# Patient Record
Sex: Female | Born: 2000 | Race: Black or African American | Hispanic: No | Marital: Single | Smoking: Former smoker
Health system: Southern US, Community
[De-identification: ages and names within clinical notes are randomized; demographics above are authoritative.]

## PROBLEM LIST (undated history)

## (undated) DIAGNOSIS — K219 Gastro-esophageal reflux disease without esophagitis: Secondary | ICD-10-CM

## (undated) DIAGNOSIS — O99212 Obesity complicating pregnancy, second trimester: Secondary | ICD-10-CM

## (undated) HISTORY — DX: Gastro-esophageal reflux disease without esophagitis: K21.9

## (undated) HISTORY — PX: NO PAST SURGERIES: SHX2092

## (undated) HISTORY — DX: Obesity complicating pregnancy, second trimester: O99.212

---
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
## 2022-05-10 DIAGNOSIS — N72 Inflammatory disease of cervix uteri: Secondary | ICD-10-CM | POA: Diagnosis not present

## 2022-05-10 DIAGNOSIS — Z3201 Encounter for pregnancy test, result positive: Secondary | ICD-10-CM | POA: Diagnosis not present

## 2022-05-10 DIAGNOSIS — Z30431 Encounter for routine checking of intrauterine contraceptive device: Secondary | ICD-10-CM | POA: Diagnosis not present

## 2022-05-14 ENCOUNTER — Ambulatory Visit: Payer: Medicaid Other | Admitting: Obstetrics and Gynecology

## 2022-06-18 ENCOUNTER — Emergency Department (HOSPITAL_COMMUNITY): Payer: Medicaid Other

## 2022-06-18 ENCOUNTER — Other Ambulatory Visit: Payer: Self-pay

## 2022-06-18 ENCOUNTER — Emergency Department (HOSPITAL_COMMUNITY)
Admission: EM | Admit: 2022-06-18 | Discharge: 2022-06-18 | Disposition: A | Discharge status: Home / Self Care | Payer: Medicaid Other | Attending: Emergency Medicine | Admitting: Emergency Medicine

## 2022-06-18 ENCOUNTER — Encounter (HOSPITAL_COMMUNITY): Payer: Self-pay

## 2022-06-18 DIAGNOSIS — R079 Chest pain, unspecified: Secondary | ICD-10-CM | POA: Diagnosis present

## 2022-06-18 DIAGNOSIS — R7989 Other specified abnormal findings of blood chemistry: Secondary | ICD-10-CM | POA: Diagnosis not present

## 2022-06-18 DIAGNOSIS — R072 Precordial pain: Secondary | ICD-10-CM | POA: Diagnosis not present

## 2022-06-18 DIAGNOSIS — R1013 Epigastric pain: Secondary | ICD-10-CM | POA: Insufficient documentation

## 2022-06-18 LAB — CBC WITH DIFFERENTIAL/PLATELET
Abs Immature Granulocytes: 0.14 10*3/uL — ABNORMAL HIGH (ref 0.00–0.07)
Basophils Absolute: 0.1 10*3/uL (ref 0.0–0.1)
Basophils Relative: 1 %
Eosinophils Absolute: 0.1 10*3/uL (ref 0.0–0.5)
Eosinophils Relative: 1 %
HCT: 27 % — ABNORMAL LOW (ref 36.0–46.0)
Hemoglobin: 9.2 g/dL — ABNORMAL LOW (ref 12.0–15.0)
Immature Granulocytes: 2 %
Lymphocytes Relative: 56 %
Lymphs Abs: 5.2 10*3/uL — ABNORMAL HIGH (ref 0.7–4.0)
MCH: 27.1 pg (ref 26.0–34.0)
MCHC: 34.1 g/dL (ref 30.0–36.0)
MCV: 79.6 fL — ABNORMAL LOW (ref 80.0–100.0)
Monocytes Absolute: 0.4 10*3/uL (ref 0.1–1.0)
Monocytes Relative: 4 %
Neutro Abs: 3.3 10*3/uL (ref 1.7–7.7)
Neutrophils Relative %: 36 %
Platelets: 292 10*3/uL (ref 150–400)
RBC: 3.39 MIL/uL — ABNORMAL LOW (ref 3.87–5.11)
RDW: 22.9 % — ABNORMAL HIGH (ref 11.5–15.5)
WBC: 9.1 10*3/uL (ref 4.0–10.5)
nRBC: 0.5 % — ABNORMAL HIGH (ref 0.0–0.2)

## 2022-06-18 LAB — COMPREHENSIVE METABOLIC PANEL WITH GFR
ALT: 37 U/L (ref 0–44)
AST: 34 U/L (ref 15–41)
Albumin: 4.1 g/dL (ref 3.5–5.0)
Alkaline Phosphatase: 92 U/L (ref 38–126)
Anion gap: 6 (ref 5–15)
BUN: 15 mg/dL (ref 6–20)
CO2: 21 mmol/L — ABNORMAL LOW (ref 22–32)
Calcium: 8.1 mg/dL — ABNORMAL LOW (ref 8.9–10.3)
Chloride: 107 mmol/L (ref 98–111)
Creatinine, Ser: 0.59 mg/dL (ref 0.44–1.00)
GFR, Estimated: >60 mL/min
Glucose, Bld: 87 mg/dL (ref 70–99)
Potassium: 3.1 mmol/L — ABNORMAL LOW (ref 3.5–5.1)
Sodium: 134 mmol/L — ABNORMAL LOW (ref 135–145)
Total Bilirubin: 3.6 mg/dL — ABNORMAL HIGH (ref 0.3–1.2)
Total Protein: 7.6 g/dL (ref 6.5–8.1)

## 2022-06-18 LAB — LIPASE, BLOOD: Lipase: 44 U/L (ref 11–51)

## 2022-06-18 LAB — D-DIMER, QUANTITATIVE: D-Dimer, Quant: 1.86 ug{FEU}/mL — ABNORMAL HIGH (ref 0.00–0.50)

## 2022-06-18 LAB — HCG, QUANTITATIVE, PREGNANCY: hCG, Beta Chain, Quant, S: <1 m[IU]/mL (ref <5)

## 2022-06-18 MED ORDER — SODIUM CHLORIDE (PF) 0.9 % IJ SOLN
INTRAMUSCULAR | Status: AC
  Filled 2022-06-18: qty 50

## 2022-06-18 MED ORDER — ALUM & MAG HYDROXIDE-SIMETH 200-200-20 MG/5ML PO SUSP
30.0000 mL | Freq: Once | ORAL | Status: AC
  Administered 2022-06-18: 30 mL via ORAL
  Filled 2022-06-18: qty 30

## 2022-06-18 MED ORDER — IOHEXOL 350 MG/ML SOLN
100.0000 mL | Freq: Once | INTRAVENOUS | Status: AC | PRN
  Administered 2022-06-18: 100 mL via INTRAVENOUS

## 2022-06-18 NOTE — Discharge Instructions (Addendum)
Follow up with your family doctor.   Take 4 over the counter ibuprofen tablets 3 times a day or 2 over-the-counter naproxen tablets twice a day for pain. Also take tylenol '1000mg'$ (2 extra strength) four times a day.

## 2022-06-18 NOTE — ED Provider Notes (Signed)
[place] EMERGENCY DEPARTMENT AT [facility] Provider Note   CSN: [id number] Arrival date & time: 06/18/22  0012     History  Chief Complaint  Patient presents with   Abdominal Pain    Laurie Perez is a 22 y.o. female.  22 yo F with a cc of chest pain.  Worse with deep breathing, certain positions.  Denies pain with eating and drinking.  Denies cough congestion fever.  Denies trauma.  She denies history of PE or DVT.  Denies hemoptysis denies unilateral lower extremity edema.  Denies recent surgery.   Abdominal Pain      Home Medications Prior to Admission medications   Medication Sig Start Date End Date Taking? Authorizing Provider  acetaminophen (TYLENOL) 325 MG tablet Take 2 tablets (650 mg total) by mouth every 4 (four) hours as needed (for pain scale < 4). Patient not taking: Reported on 04/08/2021 04/03/21   Orvis Brill, DO  ibuprofen (ADVIL) 600 MG tablet Take 1 tablet (600 mg total) by mouth every 6 (six) hours. Patient not taking: Reported on 04/15/2021 04/03/21   Orvis Brill, DO  NIFEdipine (ADALAT CC) 60 MG 24 hr tablet Take 1 tablet (60 mg total) by mouth daily. Patient not taking: Reported on 06/22/2021 04/15/21   Clarnce Flock, MD  norgestimate-ethinyl estradiol (ORTHO-CYCLEN) 0.25-35 MG-MCG tablet Take 1 tablet by mouth daily. Patient not taking: Reported on 07/23/2021 06/22/21   Elvera Maria, CNM  Prenatal Vit-Fe Fumarate-FA (PRENATAL MULTIVITAMIN) TABS tablet Take 1 tablet by mouth daily at 12 noon. Patient not taking: Reported on 04/15/2021    [provider]      Allergies    Patient has no known allergies.    Review of Systems   Review of Systems  Gastrointestinal:  Positive for abdominal pain.    Physical Exam Updated Vital Signs BP (!) 152/80   Pulse 93   Temp 98.8 F (37.1 C) (Oral)   Resp 20   Wt 132 kg   SpO2 100%   BMI 44.25 kg/m  Physical Exam Vitals and nursing note reviewed.   Constitutional:      General: She is not in acute distress.    Appearance: She is well-developed. She is not diaphoretic.  HENT:     Head: Normocephalic and atraumatic.  Eyes:     Pupils: Pupils are equal, round, and reactive to light.  Cardiovascular:     Rate and Rhythm: Normal rate and regular rhythm.     Heart sounds: No murmur heard.    No friction rub. No gallop.  Pulmonary:     Effort: Pulmonary effort is normal.     Breath sounds: No wheezing or rales.  Abdominal:     General: There is no distension.     Palpations: Abdomen is soft.     Tenderness: There is abdominal tenderness in the epigastric area.     Comments: Pain to the epigastrium.  No obvious pain in the right upper quadrant.  Some pain over the sternum.  Musculoskeletal:        General: No tenderness.     Cervical back: Normal range of motion and neck supple.  Skin:    General: Skin is warm and dry.  Neurological:     Mental Status: She is alert and oriented to person, place, and time.  Psychiatric:        Behavior: Behavior normal.     ED Results / Procedures / Treatments   Labs (all labs ordered  are listed, but only abnormal results are displayed) Labs Reviewed  CBC WITH DIFFERENTIAL/PLATELET - Abnormal; Notable for the following components:      Result Value   RBC 3.39 (*)    Hemoglobin 9.2 (*)    HCT 27.0 (*)    MCV 79.6 (*)    RDW 22.9 (*)    nRBC 0.5 (*)    Lymphs Abs 5.2 (*)    Abs Immature Granulocytes 0.14 (*)    All other components within normal limits  COMPREHENSIVE METABOLIC PANEL - Abnormal; Notable for the following components:   Sodium 134 (*)    Potassium 3.1 (*)    CO2 21 (*)    Calcium 8.1 (*)    Total Bilirubin 3.6 (*)    All other components within normal limits  D-DIMER, QUANTITATIVE - Abnormal; Notable for the following components:   D-Dimer, Quant 1.86 (*)    All other components within normal limits  LIPASE, BLOOD  HCG, QUANTITATIVE, PREGNANCY    EKG EKG  Interpretation  Date/Time:  Friday June 18 2022 00:36:44 EST Ventricular Rate:  104 PR Interval:  160 QRS Duration: 81 QT Interval:  343 QTC Calculation: 452 R Axis:   7 Text Interpretation: Sinus tachycardia Low voltage, precordial leads No old tracing to compare Confirmed by Deno Etienne [phone]) on 06/18/2022 12:40:38 AM  Radiology CT Angio Chest PE W and/or Wo Contrast  Result Date: 06/18/2022 CLINICAL DATA:  Pulmonary embolism (PE) suspected, low to intermediate prob, positive D-dimer. Upper abdominal pain, nausea EXAM: CT ANGIOGRAPHY CHEST WITH CONTRAST TECHNIQUE: Multidetector CT imaging of the chest was performed using the standard protocol during bolus administration of intravenous contrast. Multiplanar CT image reconstructions and MIPs were obtained to evaluate the vascular anatomy. RADIATION DOSE REDUCTION: This exam was performed according to the departmental dose-optimization program which includes automated exposure control, adjustment of the mA and/or kV according to patient size and/or use of iterative reconstruction technique. CONTRAST:  146m OMNIPAQUE IOHEXOL 350 MG/ML SOLN COMPARISON:  None Available. FINDINGS: Cardiovascular: No filling defects in the pulmonary arteries to suggest pulmonary emboli. Insert heart is Mediastinum/Nodes: No mediastinal, hilar, or axillary adenopathy. Trachea and esophagus are unremarkable. Thyroid unremarkable. 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 Lungs/Pleura: Lungs are clear. No focal airspace opacities or suspicious nodules. No effusions. Upper Abdomen: No acute findings Musculoskeletal: Chest wall soft tissues are unremarkable. No acute bony abnormality. Review of the MIP images confirms the above findings. IMPRESSION: No evidence of pulmonary embolus. No acute cardiopulmonary disease. Electronically Signed   By: KRolm BaptiseM.D.   On: 06/18/2022 02:21   DG Chest Port 1 View  Result Date: 06/18/2022 CLINICAL DATA:  Chest pain EXAM: PORTABLE  CHEST 1 VIEW COMPARISON:  None Available. FINDINGS: The heart size and mediastinal contours are within normal limits. Both lungs are clear. The visualized skeletal structures are unremarkable. IMPRESSION: Negative. Electronically Signed   By: KRolm BaptiseM.D.   On: 06/18/2022 00:58    Procedures Procedures    Medications Ordered in ED Medications  sodium chloride (PF) 0.9 % injection (has no administration in time range)  alum & mag hydroxide-simeth (MAALOX/MYLANTA) 200-200-20 MG/5ML suspension 30 mL (30 mLs Oral Given 06/18/22 0041)  iohexol (OMNIPAQUE) 350 MG/ML injection 100 mL (100 mLs Intravenous Contrast Given 06/18/22 0205)    ED Course/ Medical Decision Making/ A&P  Medical Decision Making Amount and/or Complexity of Data Reviewed Labs: ordered. Radiology: ordered. ECG/medicine tests: ordered.  Risk OTC drugs. Prescription drug management.   22 yo F with a chief complaints of chest pain.  Going on for about 3 days now.  Worse with inspiration.  She is mildly tachycardic on arrival.  Unable to use the [facility] rule.  Will obtain a D-dimer.  Chest x-ray.  CBC CMP lipase.  Give a GI cocktail.  Reassess.  Ddimer elevated, will obtain CT.  LFT and lipase unremarkable.  CXR without focal infiltrate or ptx.  CT chest without PE or occult pneumonia.  Feeling better.  D/c home.   3:05 AM:  I have discussed the diagnosis/risks/treatment options with the patient.  Evaluation and diagnostic testing in the emergency department does not suggest an emergent condition requiring admission or immediate intervention beyond what has been performed at this time.  They will follow up with PCP. We also discussed returning to the ED immediately if new or worsening sx occur. We discussed the sx which are most concerning (e.g., sudden worsening pain, fever, inability to tolerate by mouth) that necessitate immediate return. Medications administered to the patient during their  visit and any new prescriptions provided to the patient are listed below.  Medications given during this visit Medications  sodium chloride (PF) 0.9 % injection (has no administration in time range)  alum & mag hydroxide-simeth (MAALOX/MYLANTA) 200-200-20 MG/5ML suspension 30 mL (30 mLs Oral Given 06/18/22 0041)  iohexol (OMNIPAQUE) 350 MG/ML injection 100 mL (100 mLs Intravenous Contrast Given 06/18/22 0205)     The patient appears reasonably screen and/or stabilized for discharge and I doubt any other medical condition or other [facility] requiring further screening, evaluation, or treatment in the ED at this time prior to discharge.          Final Clinical Impression(s) / ED Diagnoses Final diagnoses:  Nonspecific chest pain    Rx / DC Orders ED Discharge Orders     None         Deno Etienne, DO 06/18/22 [id number]

## 2022-06-18 NOTE — ED Triage Notes (Signed)
C/o intermittent upper abd pain with nausea, pain worse with inhalation x3 days.  Pt also states "I feel dehydated".

## 2022-06-21 ENCOUNTER — Emergency Department (HOSPITAL_COMMUNITY)
Admission: EM | Admit: 2022-06-21 | Discharge: 2022-06-21 | Disposition: A | Discharge status: Home / Self Care | Payer: Medicaid Other | Attending: Emergency Medicine | Admitting: Emergency Medicine

## 2022-06-21 ENCOUNTER — Encounter (HOSPITAL_COMMUNITY): Payer: Self-pay

## 2022-06-21 ENCOUNTER — Other Ambulatory Visit: Payer: Self-pay

## 2022-06-21 DIAGNOSIS — M542 Cervicalgia: Secondary | ICD-10-CM | POA: Insufficient documentation

## 2022-06-21 DIAGNOSIS — Z1152 Encounter for screening for COVID-19: Secondary | ICD-10-CM | POA: Insufficient documentation

## 2022-06-21 DIAGNOSIS — J029 Acute pharyngitis, unspecified: Secondary | ICD-10-CM | POA: Diagnosis not present

## 2022-06-21 DIAGNOSIS — B9789 Other viral agents as the cause of diseases classified elsewhere: Secondary | ICD-10-CM | POA: Diagnosis not present

## 2022-06-21 LAB — RESP PANEL BY RT-PCR (RSV, FLU A&B, COVID)  RVPGX2
Influenza A by PCR: NEGATIVE
Influenza B by PCR: NEGATIVE
Resp Syncytial Virus by PCR: NEGATIVE
SARS Coronavirus 2 by RT PCR: NEGATIVE

## 2022-06-21 LAB — GROUP A STREP BY PCR: Group A Strep by PCR: NOT DETECTED

## 2022-06-21 LAB — MONONUCLEOSIS SCREEN: Mono Screen: POSITIVE — AB

## 2022-06-21 MED ORDER — DEXAMETHASONE 4 MG PO TABS
4.0000 mg | ORAL_TABLET | Freq: Once | ORAL | Status: AC
  Administered 2022-06-21: 4 mg via ORAL
  Filled 2022-06-21: qty 1

## 2022-06-21 NOTE — ED Provider Triage Note (Signed)
Emergency Medicine Provider Triage Evaluation Note  Laurie Perez , a 22 y.o. female  was evaluated in triage.  Pt complains of sore throat.  Patient reports that she had a sore throat that began approximately 1 day ago.  She reports she is having some difficulty with eating and drinking foods or liquids due to significant throat pain.  Denies any recent exposure to sick contacts and with similar symptoms.  Denies any fevers, congestion, cough, abdominal pain, nausea vomiting, diarrhea..  Review of Systems  Positive: As above Negative: As above  Physical Exam  BP (!) 127/95 (BP Location: Left Arm)   Pulse 90   Temp 99 F (37.2 C) (Oral)   Resp 16   Ht '5\' 8"'$  (1.727 m)   Wt 123.4 kg   SpO2 100%   BMI 41.36 kg/m  Gen:   Awake, no distress   Resp:  Normal effort  MSK:   Moves extremities without difficulty  Other:  No obvious or significant oropharynx erythema noted.  No significant cervical lymphadenopathy.  Medical Decision Making  Medically screening exam initiated at 2:33 PM.  Appropriate orders placed.  Laurie Perez was informed that the remainder of the evaluation will be completed by another provider, this initial triage assessment does not replace that evaluation, and the importance of remaining in the ED until their evaluation is complete.     Luvenia Heller, PA-C 06/21/22 1434

## 2022-06-21 NOTE — ED Triage Notes (Signed)
Patient has had a sore throat since last night. Painful to swallow.

## 2022-06-21 NOTE — Discharge Instructions (Addendum)
You have been seen today for your complaint of sore throat. Your lab work was reassuring and showed no abnormalities. Your discharge medications include Alternate tylenol and ibuprofen for pain and fevers. You may alternate these every 4 hours. You may take up to 800 mg of ibuprofen at a time and up to 1000 mg of tylenol. Home care instructions are as follows:  Follow the directions listed in this packet for symptomatic relief Follow up with: Your primary care doctor in 1 week for reevaluation of your symptoms Please seek immediate medical care if you develop any of the following symptoms: Your neck becomes stiff. You drool or are unable to swallow liquids. You cannot drink or take medicines without vomiting. You have severe pain that does not go away, even after you take medicine. You have trouble breathing, and it is not caused by a stuffy nose. You have new pain and swelling in your joints such as the knees, ankles, wrists, or elbows. At this time there does not appear to be the presence of an emergent medical condition, however there is always the potential for conditions to change. Please read and follow the below instructions.  Do not take your medicine if  develop an itchy rash, swelling in your mouth or lips, or difficulty breathing; call 911 and seek immediate emergency medical attention if this occurs.  You may review your lab tests and imaging results in their entirety on your MyChart account.  Please discuss all results of fully with your primary care provider and other specialist at your follow-up visit.  Note: Portions of this text may have been transcribed using voice recognition software. Every effort was made to ensure accuracy; however, inadvertent computerized transcription errors may still be present.

## 2022-06-21 NOTE — ED Provider Notes (Signed)
Laurie Perez Provider Note   CSN: [id number] Arrival date & time: 06/21/22  1417     History  Chief Complaint  Patient presents with   Sore Throat    Laurie Perez is a 22 y.o. female.  With history of GERD who presents to the ED for evaluation of sore throat and neck pain.  Symptoms began yesterday and have progressively gotten worse.  States it is painful to fully open her mouth.  Also painful to swallow.  States it feels like swallowing glass.  Also states that she has a dry and cottony feeling in her throat.  It is difficult for her to eat at home due to the pain.  She was able to tolerate p.o. today with Tylenol which she reports did not improve her symptoms.  States she has had strep in the past and does not feel the same.  Denies fevers or chills, chest pain, difficulty breathing, voice change, drooling.  She reports feeling otherwise at baseline.   Sore Throat       Home Medications Prior to Admission medications   Medication Sig Start Date End Date Taking? Authorizing Provider  acetaminophen (TYLENOL) 325 MG tablet Take 2 tablets (650 mg total) by mouth every 4 (four) hours as needed (for pain scale < 4). Patient not taking: Reported on 04/08/2021 04/03/21   Orvis Brill, DO  ibuprofen (ADVIL) 600 MG tablet Take 1 tablet (600 mg total) by mouth every 6 (six) hours. Patient not taking: Reported on 04/15/2021 04/03/21   Orvis Brill, DO  NIFEdipine (ADALAT CC) 60 MG 24 hr tablet Take 1 tablet (60 mg total) by mouth daily. Patient not taking: Reported on 06/22/2021 04/15/21   Clarnce Flock, MD  norgestimate-ethinyl estradiol (ORTHO-CYCLEN) 0.25-35 MG-MCG tablet Take 1 tablet by mouth daily. Patient not taking: Reported on 07/23/2021 06/22/21   Laurie Perez, CNM  Prenatal Vit-Fe Fumarate-FA (PRENATAL MULTIVITAMIN) TABS tablet Take 1 tablet by mouth daily at 12 noon. Patient not taking: Reported on 04/15/2021     [provider]      Allergies    Patient has no known allergies.    Review of Systems   Review of Systems  HENT:  Positive for sore throat.   All other systems reviewed and are negative.   Physical Exam Updated Vital Signs BP (!) 127/95 (BP Location: Left Arm)   Pulse 90   Temp 99 F (37.2 C) (Oral)   Resp 16   Ht '5\' 8"'$  (1.727 m)   Wt 123.4 kg   SpO2 100%   BMI 41.36 kg/m  Physical Exam Vitals and nursing note reviewed.  Constitutional:      General: She is not in acute distress.    Appearance: She is well-developed. She is obese. She is not ill-appearing.  HENT:     Head: Normocephalic and atraumatic.     Mouth/Throat:     Mouth: Mucous membranes are moist.     Pharynx: Oropharynx is clear. Uvula midline.     Tonsils: 2+ on the right. 2+ on the left.     Comments: Bilateral tonsil stones present.  Uvula midline.  Palate soft to palpation and rises.  No obvious PTA or RPA.  No trismus.  Normal phonation.  No drooling or tripoding Eyes:     Conjunctiva/sclera: Conjunctivae normal.  Neck:     Comments: No posterior or anterior cervical lymphadenopathy Cardiovascular:     Rate and Rhythm: Normal rate and  regular rhythm.     Heart sounds: No murmur heard. Pulmonary:     Effort: Pulmonary effort is normal. No respiratory distress.     Breath sounds: Normal breath sounds. No stridor. No wheezing, rhonchi or rales.  Abdominal:     Palpations: Abdomen is soft.     Tenderness: There is no abdominal tenderness.  Musculoskeletal:        General: No swelling.     Cervical back: Neck supple.  Lymphadenopathy:     Cervical: No cervical adenopathy.  Skin:    General: Skin is warm and dry.     Capillary Refill: Capillary refill takes less than 2 seconds.  Neurological:     Mental Status: She is alert.  Psychiatric:        Mood and Affect: Mood normal.     ED Results / Procedures / Treatments   Labs (all labs ordered are listed, but only abnormal results are  displayed) Labs Reviewed  RESP PANEL BY RT-PCR (RSV, FLU A&B, COVID)  [id number]  Perez A STREP BY PCR  MONONUCLEOSIS SCREEN    EKG None  Radiology No results found.  Procedures Procedures    Medications Ordered in ED Medications  dexamethasone (DECADRON) tablet 4 mg (4 mg Oral Given 06/21/22 1701)    ED Course/ Medical Decision Making/ A&P                             Medical Decision Making Amount and/or Complexity of Data Reviewed Labs: ordered.  Risk Prescription drug management.  This patient presents to the ED for concern of sore throat, this involves an extensive number of treatment options, and is a complaint that carries with it a high risk of complications and morbidity.  The differential diagnosis includes viral pharyngitis, strep pharyngitis, mononucleosis, retropharyngeal or peritonsillar abscess  My initial workup includes respiratory panel, strep test  Additional history obtained from: Nursing notes from this visit.  I ordered, reviewed and interpreted labs which include: Respiratory panel, strep test, monoscreen.  Respiratory panel and strep test negative  Afebrile, hemodynamically stable.  22 year old female presents ED for evaluation of sore throat.  On exam, she has bilateral 2+ tonsil swelling with a midline uvula, mild posterior oropharyngeal erythema.  No retropharyngeal or peritonsillar abscess noted.  She is in no respiratory distress.  She is not drooling or tripoding.  Strep and aphthoid panel negative.  Patient was requesting monoscreen.  No cervical lymphadenopathy noted.  Patient likely has viral pharyngitis.  Was given 1 dose of Decadron in the ED and educated on appropriate home care for pharyngitis.  She was encouraged to follow-up with her primary care provider in 1 week for reevaluation of her symptoms.  She was given return precautions.  Stable at discharge.  At this time there does not appear to be any evidence of an acute emergency medical  condition and the patient appears stable for discharge with appropriate outpatient follow up. Diagnosis was discussed with patient who verbalizes understanding of care plan and is agreeable to discharge. I have discussed return precautions with patient who verbalizes understanding. Patient encouraged to follow-up with their PCP within 1 week. All questions answered. Note: Portions of this report may have been transcribed using voice recognition software. Every effort was made to ensure accuracy; however, inadvertent computerized transcription errors may still be present.        Final Clinical Impression(s) / ED Diagnoses Final diagnoses:  Viral pharyngitis  Rx / DC Orders ED Discharge Orders     None         Nehemiah Massed 06/21/22 1732    Cristie Hem, MD 06/21/22 [phone]

## 2022-06-23 ENCOUNTER — Emergency Department (HOSPITAL_COMMUNITY)
Admission: EM | Admit: 2022-06-23 | Discharge: 2022-06-23 | Disposition: A | Discharge status: Home / Self Care | Payer: Medicaid Other | Attending: Emergency Medicine | Admitting: Emergency Medicine

## 2022-06-23 ENCOUNTER — Other Ambulatory Visit: Payer: Self-pay

## 2022-06-23 DIAGNOSIS — B2790 Infectious mononucleosis, unspecified without complication: Secondary | ICD-10-CM | POA: Diagnosis not present

## 2022-06-23 DIAGNOSIS — B2799 Infectious mononucleosis, unspecified with other complication: Secondary | ICD-10-CM | POA: Diagnosis not present

## 2022-06-23 DIAGNOSIS — J029 Acute pharyngitis, unspecified: Secondary | ICD-10-CM | POA: Diagnosis not present

## 2022-06-23 LAB — GROUP A STREP BY PCR: Group A Strep by PCR: NOT DETECTED

## 2022-06-23 MED ORDER — PREDNISOLONE 15 MG/5ML PO SOLN: 50.0000 mg | Freq: Every day | ORAL | 0 refills | Status: AC

## 2022-06-23 MED ORDER — LIDOCAINE VISCOUS HCL 2 % MT SOLN
15.0000 mL | Freq: Once | OROMUCOSAL | Status: AC
  Administered 2022-06-23: 15 mL via OROMUCOSAL
  Filled 2022-06-23: qty 15

## 2022-06-23 MED ORDER — IBUPROFEN 100 MG/5ML PO SUSP
600.0000 mg | Freq: Once | ORAL | Status: AC
  Administered 2022-06-23: 600 mg via ORAL
  Filled 2022-06-23: qty 30

## 2022-06-23 NOTE — Discharge Instructions (Addendum)
Your test the other day was positive for mono.  Take liquid ibuprofen to help with your sore throat.  You can take 600 mg every 8 hours.  Use over the counter cepacol spray to help numb your throat.  Take the prednisone to help with the inflammation.

## 2022-06-23 NOTE — ED Triage Notes (Signed)
Sore throat x 4 days, denies fevers. Seen for the same on the 11th, given dose of steroids. Symptoms not better

## 2022-06-23 NOTE — ED Provider Notes (Signed)
[facility] Provider Note   CSN: [id number] Arrival date & time: 06/23/22  0310     History  Chief Complaint  Patient presents with   Sore Throat    Laurie Perez is a 22 y.o. female.   Sore Throat     Patient presents to the ED with complaints of persistent sore throat.  Patient started having symptoms 4 days ago.  She denies any trouble with fevers.  Patient states she was seen in the emergency room 2 days ago at another facility.  She had tests including a strep test and monotest.  Patient states her traps test was negative.  She was given 1 dose of steroids.  Patient states she does not feel any better.  Her throat is still sore.  It is painful for her to swallow.  She denies any fevers.  She is otherwise well cough.  No vomiting or diarrhea or difficulty breathing  Home Medications Prior to Admission medications   Medication Sig Start Date End Date Taking? Authorizing Provider  prednisoLONE (PRELONE) 15 MG/5ML SOLN Take 16.7 mLs (50 mg total) by mouth daily before breakfast for 5 days. 06/23/22 06/28/22 Yes Dorie Rank, MD  acetaminophen (TYLENOL) 325 MG tablet Take 2 tablets (650 mg total) by mouth every 4 (four) hours as needed (for pain scale < 4). Patient not taking: Reported on 04/08/2021 04/03/21   Orvis Brill, DO  ibuprofen (ADVIL) 600 MG tablet Take 1 tablet (600 mg total) by mouth every 6 (six) hours. Patient not taking: Reported on 04/15/2021 04/03/21   Orvis Brill, DO  NIFEdipine (ADALAT CC) 60 MG 24 hr tablet Take 1 tablet (60 mg total) by mouth daily. Patient not taking: Reported on 06/22/2021 04/15/21   Clarnce Flock, MD  norgestimate-ethinyl estradiol (ORTHO-CYCLEN) 0.25-35 MG-MCG tablet Take 1 tablet by mouth daily. Patient not taking: Reported on 07/23/2021 06/22/21   Elvera Maria, CNM  Prenatal Vit-Fe Fumarate-FA (PRENATAL MULTIVITAMIN) TABS tablet Take 1 tablet by mouth daily at 12  noon. Patient not taking: Reported on 04/15/2021    [provider]      Allergies    Patient has no known allergies.    Review of Systems   Review of Systems  Physical Exam Updated Vital Signs BP 120/71   Pulse 87   Temp 98.7 F (37.1 C) (Oral)   Resp 17   SpO2 100%  Physical Exam Vitals and nursing note reviewed.  Constitutional:      General: She is not in acute distress.    Appearance: She is well-developed.  HENT:     Head: Normocephalic and atraumatic.     Right Ear: External ear normal.     Left Ear: External ear normal.     Mouth/Throat:     Mouth: No oral lesions or angioedema.     Tongue: No lesions.     Pharynx: Oropharyngeal exudate and uvula swelling present.     Tonsils: Tonsillar exudate present. No tonsillar abscesses.     Comments: Tonsillar hypertrophy, erythema noted bilateral tonsils, notable exudate bilaterally, mild uvula edema but no evidence of airway compromise, no asymmetry, Eyes:     General: No scleral icterus.       Right eye: No discharge.        Left eye: No discharge.     Conjunctiva/sclera: Conjunctivae normal.  Neck:     Trachea: No tracheal deviation.  Cardiovascular:     Rate and Rhythm: Normal rate.  Pulmonary:     Effort: Pulmonary effort is normal. No respiratory distress.     Breath sounds: No stridor.  Abdominal:     General: There is no distension.  Musculoskeletal:        General: No swelling or deformity.     Cervical back: Neck supple.  Skin:    General: Skin is warm and dry.     Findings: No rash.  Neurological:     Mental Status: She is alert. Mental status is at baseline.     Cranial Nerves: No dysarthria or facial asymmetry.     Motor: No seizure activity.     ED Results / Procedures / Treatments   Labs (all labs ordered are listed, but only abnormal results are displayed) Labs Reviewed  GROUP A STREP BY PCR    EKG None  Radiology No results found.  Procedures Procedures    Medications  Ordered in ED Medications  ibuprofen (ADVIL) 100 MG/5ML suspension 600 mg (has no administration in time range)  lidocaine (XYLOCAINE) 2 % viscous mouth solution 15 mL (has no administration in time range)    ED Course/ Medical Decision Making/ A&P                             Medical Decision Making Differential diagnosis includes but not limited to bacterial pharyngitis, viral pharyngitis, peritonsillar abscess  Risk Prescription drug management.   Exam is not suggestive of an abscess.  There is symmetrical swelling and exudate.  I reviewed her previous test.  Her strep test was negative but the patient was positive for mononucleosis.  She had a repeat strep test today which is still negative.  Patient's symptoms are consistent with a viral mononucleosis pharyngitis.  Will have her take a longer course of steroids.  Medications given for pain.          Final Clinical Impression(s) / ED Diagnoses Final diagnoses:  Acute pharyngitis due to infectious mononucleosis    Rx / DC Orders ED Discharge Orders          Ordered    prednisoLONE (PRELONE) 15 MG/5ML SOLN  Daily before breakfast        06/23/22 0725              Dorie Rank, MD 06/23/22 [phone]

## 2023-02-28 ENCOUNTER — Other Ambulatory Visit: Payer: Self-pay

## 2023-02-28 ENCOUNTER — Ambulatory Visit (HOSPITAL_COMMUNITY)
Admission: EM | Admit: 2023-02-28 | Discharge: 2023-02-28 | Disposition: A | Discharge status: Home / Self Care | Payer: 59 | Attending: Family Medicine | Admitting: Family Medicine

## 2023-02-28 ENCOUNTER — Encounter (HOSPITAL_COMMUNITY): Payer: Self-pay | Admitting: Emergency Medicine

## 2023-02-28 ENCOUNTER — Ambulatory Visit (INDEPENDENT_AMBULATORY_CARE_PROVIDER_SITE_OTHER): Payer: 59

## 2023-02-28 DIAGNOSIS — R0602 Shortness of breath: Secondary | ICD-10-CM

## 2023-02-28 DIAGNOSIS — R1084 Generalized abdominal pain: Secondary | ICD-10-CM | POA: Diagnosis present

## 2023-02-28 DIAGNOSIS — I517 Cardiomegaly: Secondary | ICD-10-CM | POA: Diagnosis not present

## 2023-02-28 LAB — COMPREHENSIVE METABOLIC PANEL
ALT: 38 U/L (ref 0–44)
AST: 26 U/L (ref 15–41)
Albumin: 3.5 g/dL (ref 3.5–5.0)
Alkaline Phosphatase: 66 U/L (ref 38–126)
Anion gap: 7 (ref 5–15)
BUN: 6 mg/dL (ref 6–20)
CO2: 23 mmol/L (ref 22–32)
Calcium: 8.6 mg/dL — ABNORMAL LOW (ref 8.9–10.3)
Chloride: 105 mmol/L (ref 98–111)
Creatinine, Ser: 0.6 mg/dL (ref 0.44–1.00)
GFR, Estimated: >60 mL/min (ref >60)
Glucose, Bld: 73 mg/dL (ref 70–99)
Potassium: 3.6 mmol/L (ref 3.5–5.1)
Sodium: 135 mmol/L (ref 135–145)
Total Bilirubin: 1.5 mg/dL — ABNORMAL HIGH (ref <1.2)
Total Protein: 6.9 g/dL (ref 6.5–8.1)

## 2023-02-28 LAB — POCT URINALYSIS DIP (MANUAL ENTRY)
Bilirubin, UA: NEGATIVE
Blood, UA: NEGATIVE
Glucose, UA: NEGATIVE mg/dL
Ketones, POC UA: NEGATIVE mg/dL
Nitrite, UA: NEGATIVE
Protein Ur, POC: NEGATIVE mg/dL
Spec Grav, UA: 1.025 (ref 1.010–1.025)
Urobilinogen, UA: 4 U/dL — AB
pH, UA: 7 (ref 5.0–8.0)

## 2023-02-28 LAB — CBC WITH DIFFERENTIAL/PLATELET
Abs Immature Granulocytes: 0.04 10*3/uL (ref 0.00–0.07)
Basophils Absolute: 0.1 10*3/uL (ref 0.0–0.1)
Basophils Relative: 1 %
Eosinophils Absolute: 0.2 10*3/uL (ref 0.0–0.5)
Eosinophils Relative: 2 %
HCT: 33.8 % — ABNORMAL LOW (ref 36.0–46.0)
Hemoglobin: 11.5 g/dL — ABNORMAL LOW (ref 12.0–15.0)
Immature Granulocytes: 1 %
Lymphocytes Relative: 45 %
Lymphs Abs: 3.2 10*3/uL (ref 0.7–4.0)
MCH: 26.1 pg (ref 26.0–34.0)
MCHC: 34 g/dL (ref 30.0–36.0)
MCV: 76.6 fL — ABNORMAL LOW (ref 80.0–100.0)
Monocytes Absolute: 0.5 10*3/uL (ref 0.1–1.0)
Monocytes Relative: 8 %
Neutro Abs: 3 10*3/uL (ref 1.7–7.7)
Neutrophils Relative %: 43 %
Platelets: 345 10*3/uL (ref 150–400)
RBC: 4.41 MIL/uL (ref 3.87–5.11)
RDW: 19.2 % — ABNORMAL HIGH (ref 11.5–15.5)
WBC: 6.9 10*3/uL (ref 4.0–10.5)
nRBC: 0 % (ref 0.0–0.2)

## 2023-02-28 LAB — POCT URINE PREGNANCY: Preg Test, Ur: NEGATIVE

## 2023-02-28 LAB — LIPASE, BLOOD: Lipase: 25 U/L (ref 11–51)

## 2023-02-28 MED ORDER — DOXYCYCLINE HYCLATE 100 MG PO CAPS: 100.0000 mg | ORAL_CAPSULE | Freq: Two times a day (BID) | ORAL | 0 refills | Status: DC

## 2023-02-28 NOTE — ED Triage Notes (Signed)
Requests a pregnancy test

## 2023-02-28 NOTE — ED Triage Notes (Signed)
Chest congestion, intermittent abdominal pain.  Complains of stuffy nose.  Symptoms started Thursday.    Patient denies taking any medications for symptoms.

## 2023-02-28 NOTE — Discharge Instructions (Signed)

## 2023-03-02 NOTE — ED Provider Notes (Signed)
[facility]   [id number] 02/28/23 Arrival Time: 2595  ASSESSMENT & PLAN:  1. SOB (shortness of breath) on exertion   2. Generalized abdominal pain    I have personally viewed and independently interpreted the imaging studies ordered this visit. CXR: no acute changes.  Benign abdominal exam. No indications for urgent abdominal/pelvic imaging at this time. Discussed. CBC/CMP/lipase are pending.  She is comfortable with home observation. Agree to ED eval should any of her symptoms worsen.    Discharge Instructions      You have been seen today for abdominal pain. Your evaluation was not suggestive of any emergent condition requiring medical intervention at this time. However, some abdominal problems make take more time to appear. Therefore, it is very important for you to pay attention to any new symptoms or worsening of your current condition.  Please return here or to the Emergency Department immediately should you begin to feel worse in any way or have any of the following symptoms: increasing or different abdominal pain, persistent vomiting, inability to drink fluids, fevers, or shaking chills.   You have had labs (blood tests) sent today. We will call you with any significant abnormalities or if there is need to begin or change treatment or pursue further follow up.  You may also review your test results online through MyChart. If you do not have a MyChart account, instructions to sign up should be on your discharge paperwork.     Follow-up Information     [facility] at [facility].   Specialty: Emergency Medicine Why: If symptoms worsen in any way. Contact information: [address] [place] [address] [phone]                Reviewed expectations re: course of current medical issues. Questions answered. Outlined signs and symptoms indicating need for more acute intervention. Patient verbalized  understanding. After Visit Summary given.   SUBJECTIVE: History from: patient. Laurie Perez is a 22 y.o. female who presents with complaint of generalized abd discomfort; on/off over few days; better while here. Also reports feeling SOB at time, esp with recent stuffy nose. Denies current SOB. Denies assoc CP/n/v/diaphoresis. Normal PO intake and appetite today. No tx PTA. Denies urinary symptoms. Requests pregnancy test. No LMP recorded. (Menstrual status: IUD).  Past Surgical History:  Procedure Laterality Date   NO PAST SURGERIES       OBJECTIVE:  Vitals:   02/28/23 1015  BP: 128/83  Pulse: 89  Resp: 20  Temp: 98.1 F (36.7 C)  TempSrc: Oral  SpO2: 99%    General appearance: alert, oriented, no acute distress HEENT: [place]; AT; oropharynx moist Lungs: unlabored respirations Abdomen: soft; without distention; no specific tenderness to palpation; normal bowel sounds; without masses or organomegaly; without guarding or rebound tenderness Back: without reported CVA tenderness; FROM at waist Extremities: without LE edema; symmetrical; without gross deformities Skin: warm and dry Neurologic: normal gait Psychological: alert and cooperative; normal mood and affect  Imaging: DG Chest 2 View  Result Date: 02/28/2023 CLINICAL DATA:  Shortness of breath. EXAM: CHEST - 2 VIEW COMPARISON:  06/18/2022 and CT chest 06/18/2022. FINDINGS: Trachea is midline. Heart is enlarged. Lungs are clear. No pleural fluid. IMPRESSION: 1. No acute findings. 2. Cardiac enlargement. Electronically Signed   By: Leanna Battles M.D.   On: 02/28/2023 14:17     No Known Allergies  Past Medical History:  Diagnosis Date   GERD (gastroesophageal reflux disease)    Obesity affecting pregnancy in second trimester     Social History   Socioeconomic History   Marital status: Single    Spouse name: Not on file   Number of children: Not on file   Years of  education: Not on file   Highest education level: Not on file  Occupational History   Not on file  Tobacco Use   Smoking status: Former    Current packs/day: 0.00    Types: Cigarettes    Quit date: 07/11/2020    Years since quitting: 2.6    Passive exposure: Never   Smokeless tobacco: Never  Vaping Use   Vaping status: Never Used  Substance and Sexual Activity   Alcohol use: Never   Drug use: Never   Sexual activity: Yes    Birth control/protection: I.U.D.  Other Topics Concern   Not on file  Social History Narrative   Not on file   Social Determinants of Health   Financial Resource Strain: Not on file  Food Insecurity: No Food Insecurity (06/22/2021)   Hunger Vital Sign    Worried About Running Out of Food in the Last Year: Never true    Ran Out of Food in the Last Year: Never true  Transportation Needs: No Transportation Needs (06/22/2021)   PRAPARE - Administrator, Civil Service (Medical): No    Lack of Transportation (Non-Medical): No  Physical Activity: Not on file  Stress: Not on file  Social Connections: Unknown (08/25/2021)   [facility] from [facility]   Social Network    Social Network: Not on file  Intimate Partner Violence: Unknown (07/17/2021)   Received from [facility]   HITS    Physically Hurt: Not on file    Insult or Talk Down To: Not on file    Threaten Physical Harm: Not on file    Scream or Curse: Not on file    Family History  Problem Relation Age of Onset   Diabetes Mother    Hypertension Father    Diabetes Father      Mardella Layman, MD 03/02/23 [phone]

## 2023-04-14 ENCOUNTER — Other Ambulatory Visit: Payer: Self-pay

## 2023-04-14 ENCOUNTER — Emergency Department (HOSPITAL_COMMUNITY)
Admission: EM | Admit: 2023-04-14 | Discharge: 2023-04-14 | Disposition: A | Discharge status: Home / Self Care | Payer: 59 | Attending: Emergency Medicine | Admitting: Emergency Medicine

## 2023-04-14 DIAGNOSIS — R1909 Other intra-abdominal and pelvic swelling, mass and lump: Secondary | ICD-10-CM | POA: Diagnosis not present

## 2023-04-14 DIAGNOSIS — R229 Localized swelling, mass and lump, unspecified: Secondary | ICD-10-CM

## 2023-04-14 DIAGNOSIS — R220 Localized swelling, mass and lump, head: Secondary | ICD-10-CM | POA: Diagnosis not present

## 2023-04-14 MED ORDER — DOXYCYCLINE HYCLATE 100 MG PO CAPS: 100.0000 mg | ORAL_CAPSULE | Freq: Two times a day (BID) | ORAL | 0 refills | Status: DC

## 2023-04-14 MED ORDER — NAPROXEN 375 MG PO TABS: 375.0000 mg | ORAL_TABLET | Freq: Two times a day (BID) | ORAL | 0 refills | Status: DC

## 2023-04-14 NOTE — ED Notes (Signed)
Pt ambulatory to restroom in NAD.

## 2023-04-14 NOTE — ED Triage Notes (Signed)
 Patient reports "in grown hair" at left groin this evening .

## 2023-04-14 NOTE — ED Provider Notes (Signed)
 [facility] Provider Note   CSN: [id number] Arrival date & time: 04/14/23  0001     History  Chief Complaint  Patient presents with   In Grown Hair     Left Groin     Laurie Perez is a 23 y.o. female.  HPI   Patient presents to the ED for evaluation of a bump that she noticed in her left groin area.  Patient states she thinks it might be an ingrown hair.  It is mildly irritating.  She has not had any fevers or chills.  Home Medications Prior to Admission medications   Medication Sig Start Date End Date Taking? Authorizing Provider  doxycycline  (VIBRAMYCIN ) 100 MG capsule Take 1 capsule (100 mg total) by mouth 2 (two) times daily. 04/14/23  Yes Randol Simmonds, MD  naproxen  (NAPROSYN ) 375 MG tablet Take 1 tablet (375 mg total) by mouth 2 (two) times daily. 04/14/23  Yes Randol Simmonds, MD  acetaminophen  (TYLENOL ) 325 MG tablet Take 2 tablets (650 mg total) by mouth every 4 (four) hours as needed (for pain scale < 4). Patient not taking: Reported on 04/08/2021 04/03/21   Dameron, Marisa, DO  ibuprofen  (ADVIL ) 600 MG tablet Take 1 tablet (600 mg total) by mouth every 6 (six) hours. Patient not taking: Reported on 04/15/2021 04/03/21   Dameron, Marisa, DO  NIFEdipine  (ADALAT  CC) 60 MG 24 hr tablet Take 1 tablet (60 mg total) by mouth daily. Patient not taking: Reported on 06/22/2021 04/15/21   Lola Donnice HERO, MD  norgestimate -ethinyl estradiol  (ORTHO-CYCLEN) 0.25-35 MG-MCG tablet Take 1 tablet by mouth daily. Patient not taking: Reported on 07/23/2021 06/22/21   Milly Olam LABOR, CNM  Prenatal Vit-Fe Fumarate-FA (PRENATAL MULTIVITAMIN) TABS tablet Take 1 tablet by mouth daily at 12 noon. Patient not taking: Reported on 04/15/2021    [provider]      Allergies    Patient has no known allergies.    Review of Systems   Review of Systems  Physical Exam Updated Vital Signs BP (!) 123/54 (BP Location: Right Arm)   Pulse 78    Temp 98 F (36.7 C)   Resp 17   SpO2 100%  Physical Exam Vitals and nursing note reviewed.  Constitutional:      Appearance: She is well-developed. She is not diaphoretic.  HENT:     Head: Normocephalic and atraumatic.     Right Ear: External ear normal.     Left Ear: External ear normal.  Eyes:     General: No scleral icterus.       Right eye: No discharge.        Left eye: No discharge.     Conjunctiva/sclera: Conjunctivae normal.  Neck:     Trachea: No tracheal deviation.  Cardiovascular:     Rate and Rhythm: Normal rate.  Pulmonary:     Effort: Pulmonary effort is normal. No respiratory distress.     Breath sounds: No stridor.  Abdominal:     General: There is no distension.  Genitourinary:    Comments: Pubic area shaved, small <1 cm subcutaenous nodule lateral inferior mons pubis, no fluctuance, no eythema, no pustule,  Musculoskeletal:        General: No swelling or deformity.     Cervical back: Neck supple.  Skin:    General: Skin is warm and dry.     Findings: No rash.  Neurological:     Mental Status: She is alert. Mental status is  at baseline.     Cranial Nerves: No dysarthria or facial asymmetry.     Motor: No seizure activity.     ED Results / Procedures / Treatments   Labs (all labs ordered are listed, but only abnormal results are displayed) Labs Reviewed - No data to display  EKG None  Radiology No results found.  Procedures Procedures    Medications Ordered in ED Medications - No data to display  ED Course/ Medical Decision Making/ A&P                                 Medical Decision Making  Patient has a palpable nodule.  This is either a lymph node versus an inflamed follicle.  No discrete abscess appreciated.  Patient does not have numerous nodules to suggest significant lymphadenopathy.  She does not have any evidence of erythema or infection in her leg.  Will start a course of antibiotics for possible inflamed hair follicle.   Discussed warm soaks.  Evaluation and diagnostic testing in the emergency department does not suggest an emergent condition requiring admission or immediate intervention beyond what has been performed at this time.  The patient is safe for discharge and has been instructed to return immediately for worsening symptoms, change in symptoms or any other concerns.        Final Clinical Impression(s) / ED Diagnoses Final diagnoses:  Skin nodule    Rx / DC Orders ED Discharge Orders          Ordered    naproxen  (NAPROSYN ) 375 MG tablet  2 times daily        04/14/23 1401    doxycycline  (VIBRAMYCIN ) 100 MG capsule  2 times daily        04/14/23 1401              Randol Simmonds, MD 04/14/23 1401

## 2023-04-14 NOTE — Discharge Instructions (Addendum)
 Take the medications as prescribed.  Apply warm compresses or soak in a tub.  Follow-up with your doctor in the next week or so to be rechecked.  Return to the ER for fevers chills or other concerning symptoms

## 2023-05-03 ENCOUNTER — Encounter (HOSPITAL_COMMUNITY): Payer: Self-pay

## 2023-05-03 ENCOUNTER — Emergency Department (HOSPITAL_COMMUNITY)
Admission: EM | Admit: 2023-05-03 | Discharge: 2023-05-03 | Disposition: A | Discharge status: Home / Self Care | Payer: 59 | Attending: Emergency Medicine | Admitting: Emergency Medicine

## 2023-05-03 ENCOUNTER — Other Ambulatory Visit: Payer: Self-pay

## 2023-05-03 DIAGNOSIS — A6000 Herpesviral infection of urogenital system, unspecified: Secondary | ICD-10-CM | POA: Diagnosis not present

## 2023-05-03 DIAGNOSIS — R300 Dysuria: Secondary | ICD-10-CM | POA: Diagnosis not present

## 2023-05-03 DIAGNOSIS — A6004 Herpesviral vulvovaginitis: Secondary | ICD-10-CM | POA: Insufficient documentation

## 2023-05-03 LAB — URINALYSIS, ROUTINE W REFLEX MICROSCOPIC
Bilirubin Urine: NEGATIVE
Glucose, UA: NEGATIVE mg/dL
Ketones, ur: NEGATIVE mg/dL
Nitrite: NEGATIVE
Protein, ur: NEGATIVE mg/dL
Specific Gravity, Urine: 1.021 (ref 1.005–1.030)
pH: 5 (ref 5.0–8.0)

## 2023-05-03 MED ORDER — VALACYCLOVIR HCL 1 G PO TABS: 1000.0000 mg | ORAL_TABLET | Freq: Two times a day (BID) | ORAL | 0 refills | Status: AC

## 2023-05-03 NOTE — Discharge Instructions (Addendum)
It is recommended that you establish with a primary care provider as this rash will recur in the future.   Take Valtrex as prescribed for the full 10 days. Remember that this medication is best if started as soon as you feel symptoms start.

## 2023-05-03 NOTE — ED Triage Notes (Signed)
Pt c/o burning with urination, states that it may be coming from a sore she has in her left groin area. Pt states she had a sore there previously but it went away and then came back. Pt seen for similar symptoms recently.

## 2023-05-03 NOTE — ED Provider Notes (Signed)
Laur[facility] Perez EMERGENCY DEPARTMENT AT [facility] Provider Note   CSN: [id number] Arrival date & time: 05/03/23  1914     History  Chief Complaint  Patient presents with   Dysuria    Laurie Perez is a 23 y.o. female.  Patient to ED with report of vaginal pain. She thinks she injured herself by shaving the area recently. When she urinates there is external vaginal pain with contact with the urine. No fever, pelvic pain, vaginal discharge, bleeding. Symptoms started 4 days ago.   The history is provided by the patient. No language interpreter was used.  Dysuria      Home Medications Prior to Admission medications   Medication Sig Start Date End Date Taking? Authorizing Provider  doxylamine, Sleep, (UNISOM SLEEPTABS) 25 MG tablet Take 12.5 mg by mouth every 8 (eight) hours as needed for allergies, sleep or rhinitis. 10/12/20  Yes [provider]  pyridOXINE (VITAMIN B6) 25 MG tablet Take 25 mg by mouth every 8 (eight) hours as needed. 10/12/20  Yes [provider]  valACYclovir (VALTREX) 1000 MG tablet Take 1 tablet (1,000 mg total) by mouth 2 (two) times daily. 05/03/23  Yes Elpidio Anis, PA-C  doxycycline (VIBRAMYCIN) 100 MG capsule Take 1 capsule (100 mg total) by mouth 2 (two) times daily. 04/14/23   Linwood Dibbles, MD  Prenatal Vit-Fe Fumarate-FA (PRENATAL MULTIVITAMIN) TABS tablet Take 1 tablet by mouth daily at 12 noon. Patient not taking: Reported on 04/15/2021    [provider]      Allergies    Patient has no known allergies.    Review of Systems   Review of Systems  Genitourinary:  Positive for dysuria.    Physical Exam Updated Vital Signs BP (!) 119/102 (BP Location: Right Arm)   Pulse 75   Temp 97.9 F (36.6 C) (Oral)   Resp 18   Ht 5\' 8"  (1.727 m)   Wt 123 kg   SpO2 100%   BMI 41.23 kg/m  Physical Exam Constitutional:      General: She is not in acute distress.    Appearance: She is well-developed. She is not  ill-appearing.  Pulmonary:     Effort: Pulmonary effort is normal.  Genitourinary:    Comments: Multiple tender vulvar ulcerations extending to the perineum c/w herpetic rash. Musculoskeletal:        General: Normal range of motion.     Cervical back: Normal range of motion.  Skin:    General: Skin is warm and dry.  Neurological:     Mental Status: She is alert and oriented to person, place, and time.     ED Results / Procedures / Treatments   Labs (all labs ordered are listed, but only abnormal results are displayed) Labs Reviewed  URINALYSIS, ROUTINE W REFLEX MICROSCOPIC - Abnormal; Notable for the following components:      Result Value   APPearance HAZY (*)    Hgb urine dipstick SMALL (*)    Leukocytes,Ua LARGE (*)    Bacteria, UA RARE (*)    All other components within normal limits    EKG None  Radiology No results found.  Procedures Procedures    Medications Ordered in ED Medications - No data to display  ED Course/ Medical Decision Making/ A&P Clinical Course as of 05/03/23 0816  Tue May 03, 2023  0810 Patient with 4 days of vulvar pain. Exam findings c/w herpes. Discussed care, importance and need for PCP, use of medications best started  sooner in future outbreaks.  [SU]    Clinical Course User Index [SU] Elpidio Anis, PA-C                                 Medical Decision Making Amount and/or Complexity of Data Reviewed Labs: ordered.           Final Clinical Impression(s) / ED Diagnoses Final diagnoses:  Herpes simplex vulvovaginitis    Rx / DC Orders ED Discharge Orders          Ordered    valACYclovir (VALTREX) 1000 MG tablet  2 times daily        05/03/23 0814              Elpidio Anis, PA-C 05/03/23 0816    Rexford Maus, DO 05/03/23 [phone]

## 2023-05-25 IMAGING — US US PELVIS COMPLETE WITH TRANSVAGINAL
1 series · 15 of 25 positions shown · non-contrast
Comparison: None

CLINICAL DATA: Bleeding with IUD, abnormal uterine bleeding for 3
months, LMP 07/03/2021



[Series 1: us pelvis complete with transvaginal · 15 of 46 slices shown]
[im 1/46]
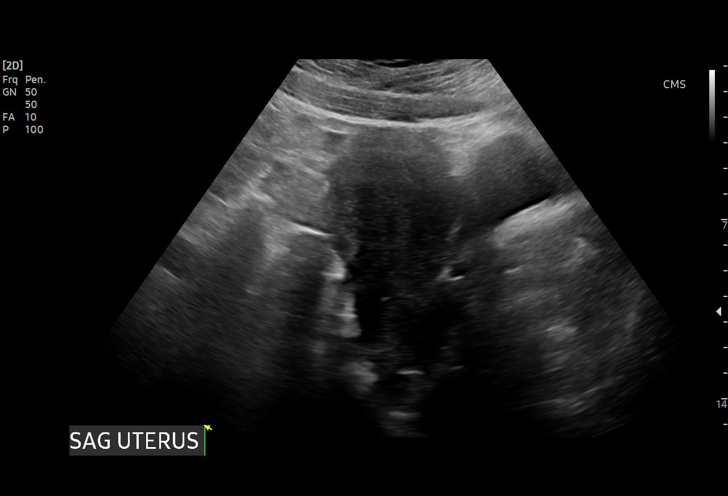
[im 4/46]
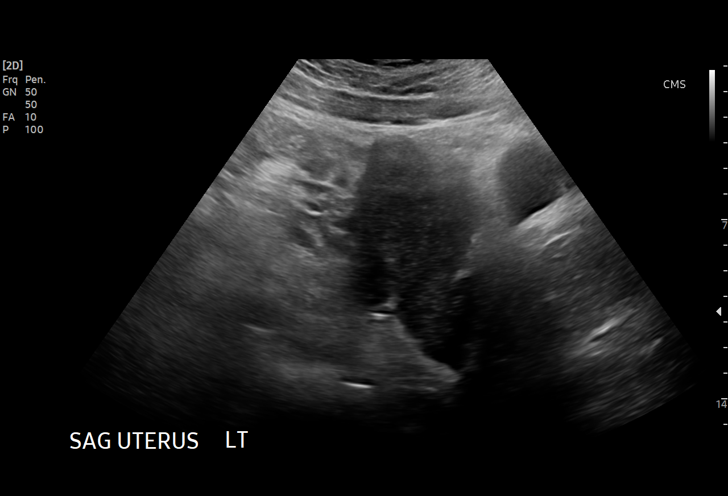
[im 8/46]
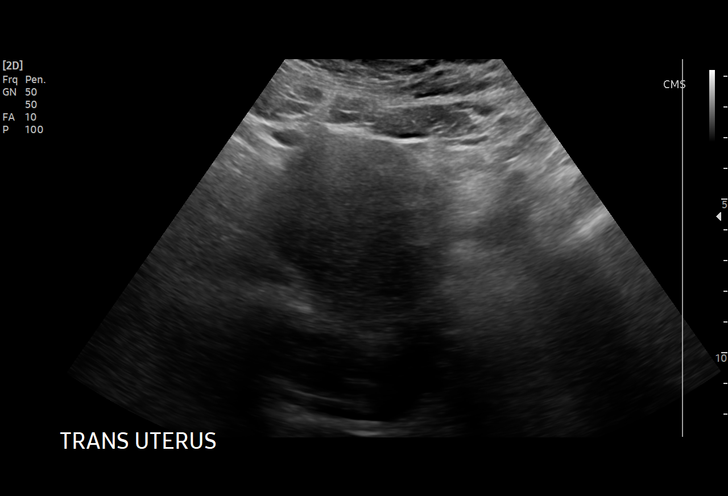
[im 10/46]
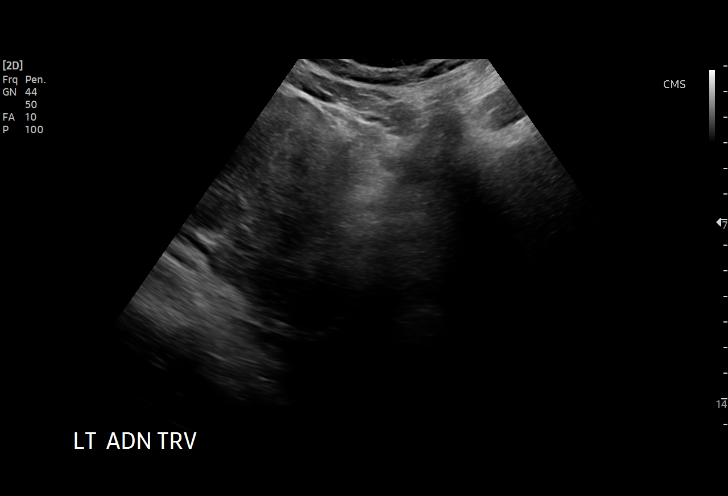
[im 14/46]
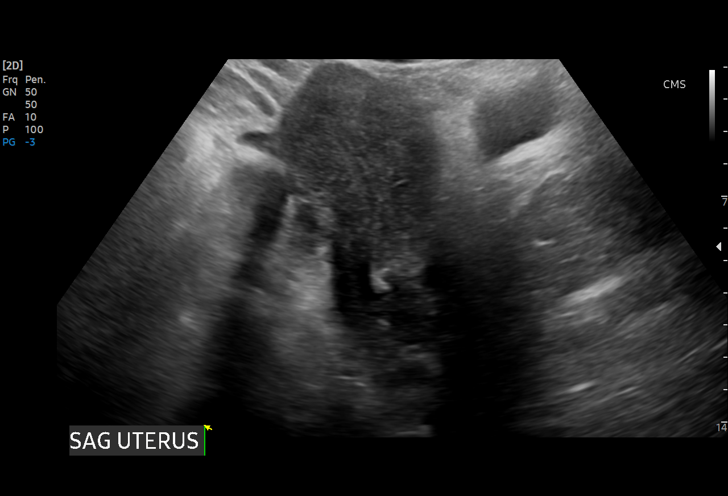
[im 17/46]
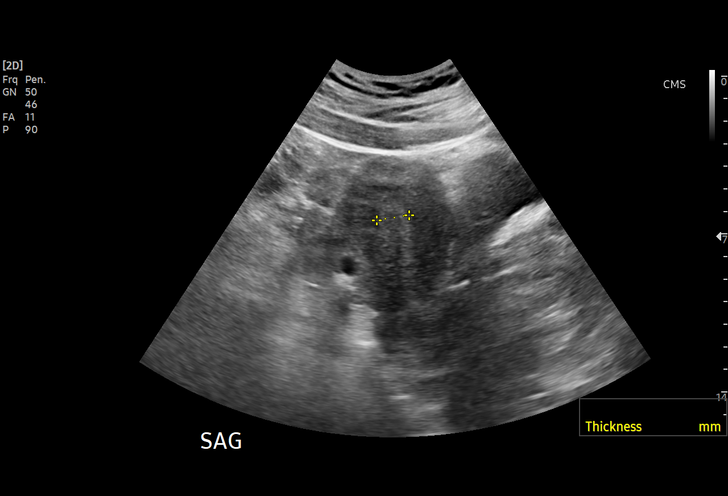
[im 19/46]
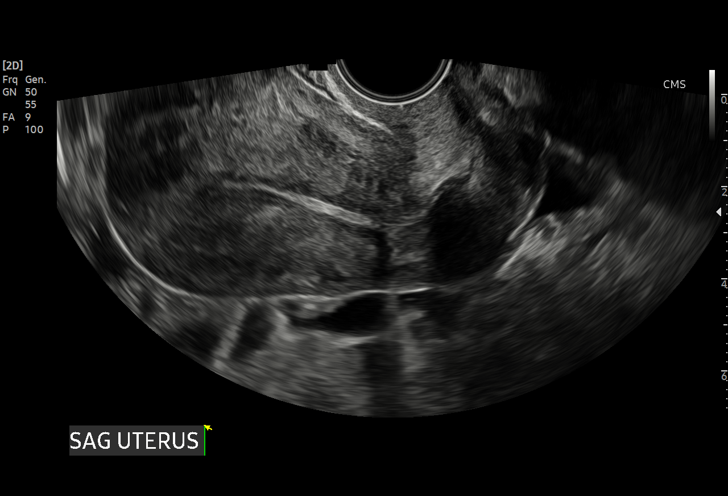
[im 23/46]
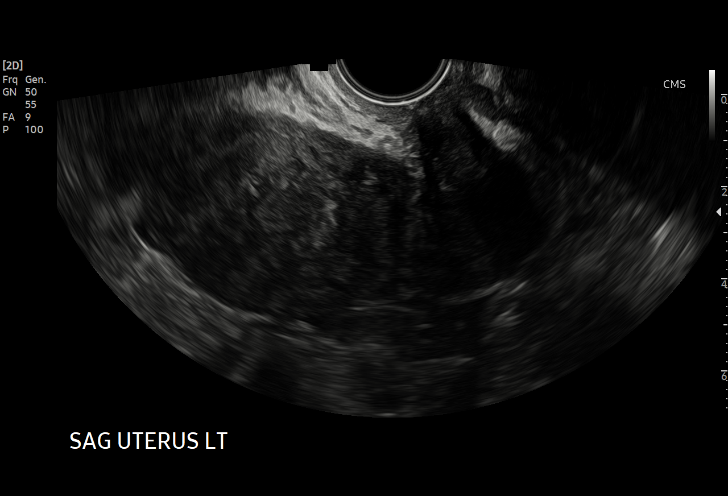
[im 27/46]
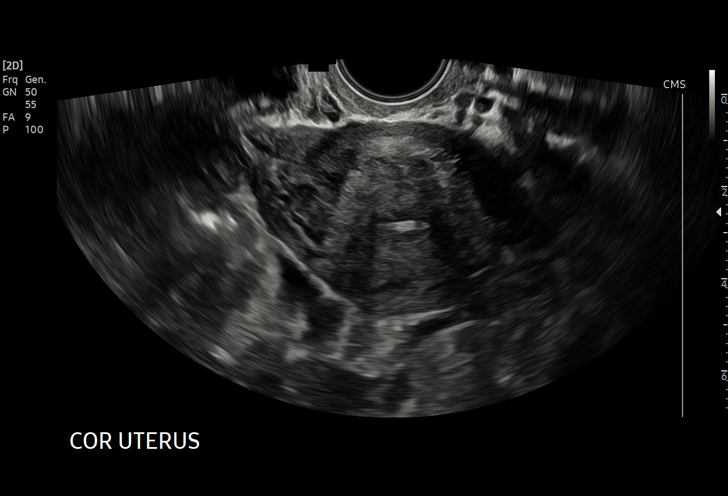
[im 29/46]
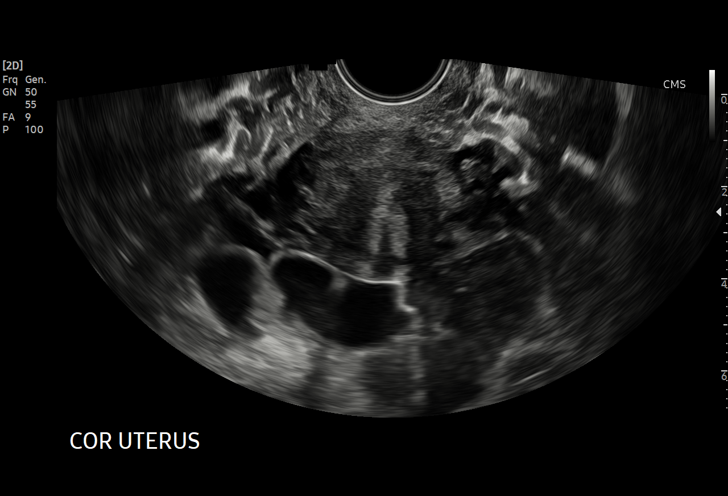
[im 32/46]
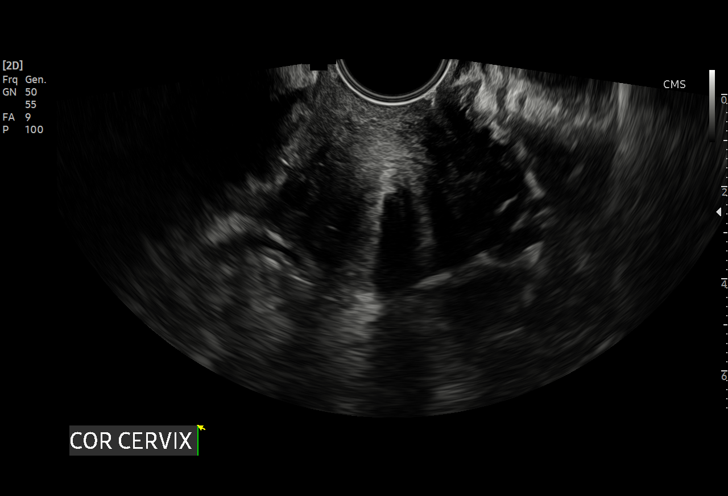
[im 36/46]
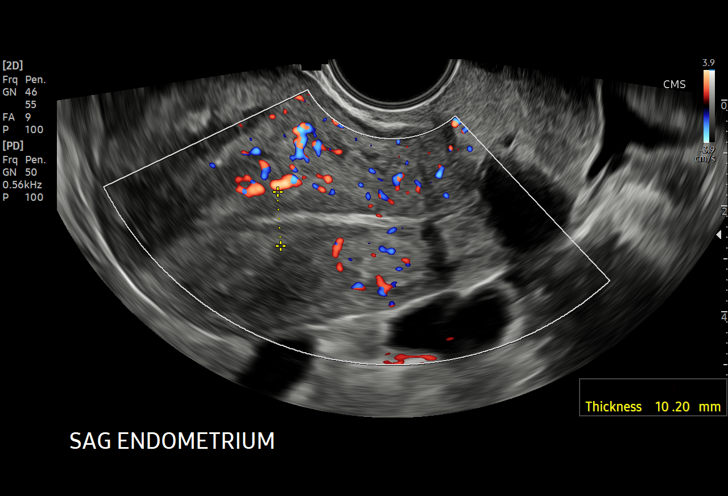
[im 38/46]
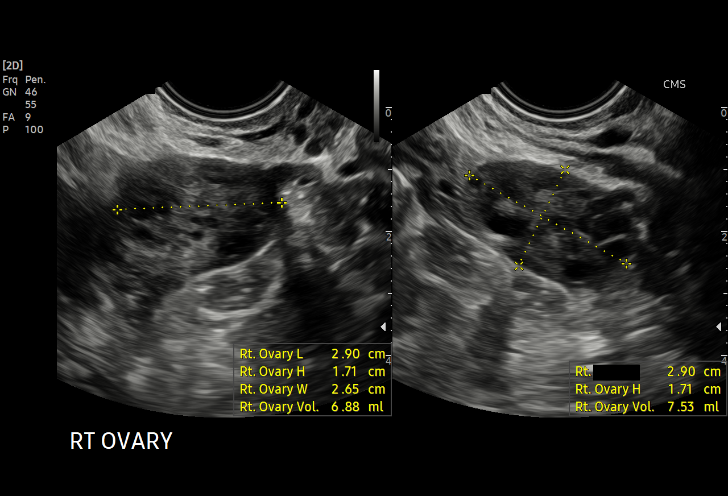
[im 42/46]
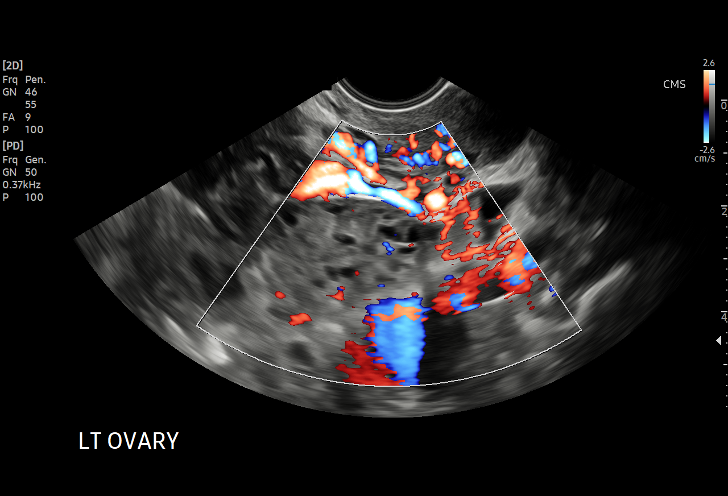
[im 46/46]
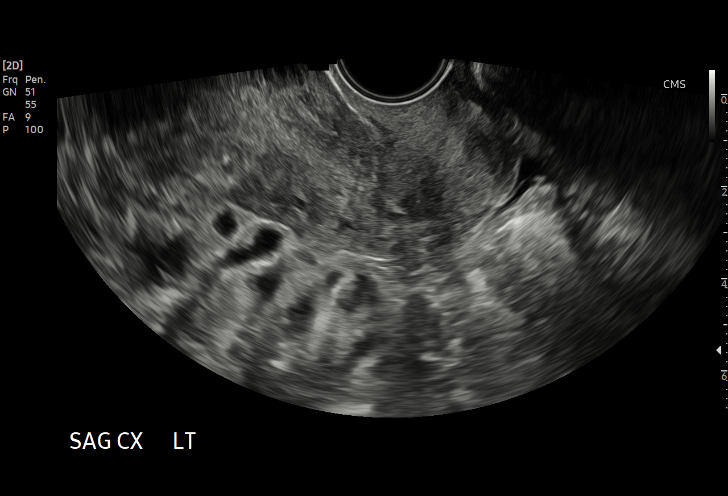

[15 of 25 positions shown; findings below may reference images not displayed]

FINDINGS: Uterus

Measurements: 9.5 x 5.3 x 5.5 cm = volume: 145 mL. Anteflexed and
mildly anteverted. Heterogeneous myometrium. No discrete mass.

Endometrium

Thickness: 10 mm. No endometrial fluid or mass. IUD is abnormal in
position, located at the cervix.

Right ovary

Measurements: 2.9 x 1.7 x 2.7 cm = volume: 6.9 mL. Normal morphology
without mass

Left ovary

Measurements: 3.5 x 2.1 x 2.1 cm = volume: 8.1 mL. Normal morphology
without mass

Other findings

No free pelvic fluid.  No adnexal masses.
IMPRESSION: Abnormal position of IUD, located within the cervix.

Remainder of exam normal.

## 2023-07-15 ENCOUNTER — Other Ambulatory Visit: Payer: Self-pay

## 2023-07-15 ENCOUNTER — Ambulatory Visit (HOSPITAL_COMMUNITY): Admission: EM | Admit: 2023-07-15 | Discharge: 2023-07-15 | Disposition: A | Discharge status: Home / Self Care

## 2023-07-15 ENCOUNTER — Emergency Department (HOSPITAL_COMMUNITY)
Admission: EM | Admit: 2023-07-15 | Discharge: 2023-07-16 | Disposition: A | Discharge status: Home / Self Care | Attending: Emergency Medicine | Admitting: Emergency Medicine

## 2023-07-15 ENCOUNTER — Encounter (HOSPITAL_COMMUNITY): Payer: Self-pay

## 2023-07-15 ENCOUNTER — Encounter (HOSPITAL_COMMUNITY): Payer: Self-pay | Admitting: *Deleted

## 2023-07-15 DIAGNOSIS — R102 Pelvic and perineal pain: Secondary | ICD-10-CM | POA: Diagnosis not present

## 2023-07-15 DIAGNOSIS — R1030 Lower abdominal pain, unspecified: Secondary | ICD-10-CM | POA: Diagnosis not present

## 2023-07-15 DIAGNOSIS — R1032 Left lower quadrant pain: Secondary | ICD-10-CM

## 2023-07-15 LAB — URINALYSIS, ROUTINE W REFLEX MICROSCOPIC
Glucose, UA: NEGATIVE mg/dL
Hgb urine dipstick: NEGATIVE
Ketones, ur: NEGATIVE mg/dL
Leukocytes,Ua: NEGATIVE
Nitrite: NEGATIVE
Protein, ur: NEGATIVE mg/dL
Specific Gravity, Urine: 1.03 (ref 1.005–1.030)
pH: 5 (ref 5.0–8.0)

## 2023-07-15 LAB — COMPREHENSIVE METABOLIC PANEL WITH GFR
ALT: 36 U/L (ref 0–44)
AST: 35 U/L (ref 15–41)
Albumin: 3.5 g/dL (ref 3.5–5.0)
Alkaline Phosphatase: 70 U/L (ref 38–126)
Anion gap: 10 (ref 5–15)
BUN: 8 mg/dL (ref 6–20)
CO2: 23 mmol/L (ref 22–32)
Calcium: 8.8 mg/dL — ABNORMAL LOW (ref 8.9–10.3)
Chloride: 108 mmol/L (ref 98–111)
Creatinine, Ser: 0.72 mg/dL (ref 0.44–1.00)
GFR, Estimated: >60 mL/min (ref >60)
Glucose, Bld: 100 mg/dL — ABNORMAL HIGH (ref 70–99)
Potassium: 3.6 mmol/L (ref 3.5–5.1)
Sodium: 141 mmol/L (ref 135–145)
Total Bilirubin: 2.1 mg/dL — ABNORMAL HIGH (ref 0.0–1.2)
Total Protein: 6.7 g/dL (ref 6.5–8.1)

## 2023-07-15 LAB — HCG, SERUM, QUALITATIVE: Preg, Serum: NEGATIVE

## 2023-07-15 LAB — CBC
HCT: 33.9 % — ABNORMAL LOW (ref 36.0–46.0)
Hemoglobin: 11.7 g/dL — ABNORMAL LOW (ref 12.0–15.0)
MCH: 27.3 pg (ref 26.0–34.0)
MCHC: 34.5 g/dL (ref 30.0–36.0)
MCV: 79 fL — ABNORMAL LOW (ref 80.0–100.0)
Platelets: 339 10*3/uL (ref 150–400)
RBC: 4.29 MIL/uL (ref 3.87–5.11)
RDW: 19 % — ABNORMAL HIGH (ref 11.5–15.5)
WBC: 8 10*3/uL (ref 4.0–10.5)
nRBC: 0 % (ref 0.0–0.2)

## 2023-07-15 LAB — LIPASE, BLOOD: Lipase: 30 U/L (ref 11–51)

## 2023-07-15 NOTE — Discharge Instructions (Signed)
 You were seen today for pain along the inguinal crease or the space between your stomach and your upper thigh.  At this time I am concerned that you may have an inguinal hernia that might be getting pinched or cut off while you are sitting.  I recommend that you go to the emergency room for evaluation with a CT scan of the area to see if there is a hernia and to determine if it is incarcerated or strangulated.  I recommend going to the emergency room immediately after you are discharged from urgent care.  If you are unable to make it to the emergency room please call EMS at 911 for assistance.

## 2023-07-15 NOTE — ED Triage Notes (Signed)
 Pt states her left upper leg hurts X 1 week when she puts weight on it. She took tylenol yesterday   She states she is having lower abdominal pain and cramping since last night.

## 2023-07-15 NOTE — ED Triage Notes (Signed)
 Patient sent by UC for work up for possible hernia d/t lower abdominal pain and cramping since last night. Patient reports she does not get monthly cycle d/t having IUD. Patient also reports left groin pain when she walks or drives x 1 week. Patient A&Ox4, no distress in triage.

## 2023-07-15 NOTE — ED Provider Notes (Signed)
 [facility]    CSN: [id number] Arrival date & time: 07/15/23  1802      History   Chief Complaint Chief Complaint  Patient presents with   Leg Pain    HPI Laurie Perez is a 23 y.o. female.   HPI  She reports pain in her left inguinal area that spreads into upper  thigh  She states that sitting or laying in bed causes pain  She states the pain has been present for about a week She states she is also having suprapubic pain since last night. She states she thinks she is due for her period but since she has IUD she is not completely sure She reports the abdominal pain feels like cramping  She denies swelling, redness, lumps, bumps, rashes  She reports she has had similar pain in the past and was treated for this last year  Pain level: 8/10 stabbing and achy  Interventions: Tylenol     Past Medical History:  Diagnosis Date   GERD (gastroesophageal reflux disease)    Obesity affecting pregnancy in second trimester     Patient Active Problem List   Diagnosis Date Noted   Breakthrough bleeding associated with intrauterine device (IUD) 06/23/2021    Past Surgical History:  Procedure Laterality Date   NO PAST SURGERIES      OB History     Gravida  1   Para  1   Term  1   Preterm      AB      Living  1      SAB      IAB      Ectopic      Multiple  0   Live Births  1            Home Medications    Prior to Admission medications   Medication Sig Start Date End Date Taking? Authorizing Provider  valACYclovir (VALTREX) 1000 MG tablet Take 1 tablet (1,000 mg total) by mouth 2 (two) times daily. 05/03/23   Elpidio Anis, PA-C    Family History Family History  Problem Relation Age of Onset   Diabetes Mother    Hypertension Father    Diabetes Father     Social History Social History   Tobacco Use   Smoking status: Former    Current packs/day: 0.00    Types: Cigarettes    Quit date: 07/11/2020    Years since quitting:  3.0    Passive exposure: Never   Smokeless tobacco: Never  Vaping Use   Vaping status: Never Used  Substance Use Topics   Alcohol use: Never   Drug use: Never     Allergies   Patient has no known allergies.   Review of Systems Review of Systems   Physical Exam Triage Vital Signs ED Triage Vitals  Encounter Vitals Group     BP 07/15/23 1841 130/77     Systolic BP Percentile --      Diastolic BP Percentile --      Pulse Rate 07/15/23 1841 98     Resp 07/15/23 1841 20     Temp 07/15/23 1841 98.1 F (36.7 C)     Temp Source 07/15/23 1841 Oral     SpO2 07/15/23 1841 99 %     Weight --      Height --      Head Circumference --      Peak Flow --      Pain Score 07/15/23 1840  8     Pain Loc --      Pain Education --      Exclude from Growth Chart --    No data found.  Updated Vital Signs BP 130/77 (BP Location: Left Arm)   Pulse 98   Temp 98.1 F (36.7 C) (Oral)   Resp 20   SpO2 99%   Visual Acuity Right Eye Distance:   Left Eye Distance:   Bilateral Distance:    Right Eye Near:   Left Eye Near:    Bilateral Near:     Physical Exam Vitals reviewed.  Constitutional:      General: She is awake. She is not in acute distress.    Appearance: Normal appearance. She is well-developed and well-groomed. She is not ill-appearing or toxic-appearing.     Comments: Patient was seen sitting comfortably in exam chair, eating ice chips, no apparent acute distress.  Patient appears stated age.  HENT:     Head: Normocephalic and atraumatic.  Abdominal:     General: Abdomen is protuberant. Bowel sounds are normal.     Palpations: Abdomen is soft.     Comments: Patient has notable tenderness to the left inguinal area.  I am unsure if there is a hernia defect and I am not able to appreciate a bulge even with Valsalva maneuvers.  Skin:    General: Skin is warm and dry.  Neurological:     General: No focal deficit present.     Mental Status: She is alert and oriented to  person, place, and time.  Psychiatric:        Mood and Affect: Mood normal.        Behavior: Behavior normal. Behavior is cooperative.      UC Treatments / Results  Labs (all labs ordered are listed, but only abnormal results are displayed) Labs Reviewed - No data to display  EKG   Radiology No results found.  Procedures Procedures (including critical care time)  Medications Ordered in UC Medications - No data to display  Initial Impression / Assessment and Plan / UC Course  I have reviewed the triage vital signs and the nursing notes.  Pertinent labs & imaging results that were available during my care of the patient were reviewed by me and considered in my medical decision making (see chart for details).      Final Clinical Impressions(s) / UC Diagnoses   Final diagnoses:  Inguinal pain, left  Lower abdominal pain   Patient reports concern for left inguinal pain that has been getting progressively worse.  She reports that this pain is worse when she is sitting or driving.  I was unable to palpate a hernia defect or appreciate a bulge in the inguinal area but I am concerned for potential inguinal hernia that is intermittently pinched and potentially incarcerated due to sitting position.  I reviewed that this diagnosis will need to be confirmed with imaging such as a CT scan or ultrasound which we do not have at urgent care.  Recommend patient goes to the emergency room given her increased pain over the last few days for definitive rule out..  Patient voiced understanding and agreement with recommendation stated that she can get to the emergency room via private vehicle.  She declines EMS services.    Discharge Instructions      You were seen today for pain along the inguinal crease or the space between your stomach and your upper thigh.  At this time I  am concerned that you may have an inguinal hernia that might be getting pinched or cut off while you are sitting.  I  recommend that you go to the emergency room for evaluation with a CT scan of the area to see if there is a hernia and to determine if it is incarcerated or strangulated.  I recommend going to the emergency room immediately after you are discharged from urgent care.  If you are unable to make it to the emergency room please call EMS at 911 for assistance.     ED Prescriptions   None    PDMP not reviewed this encounter.   Roselind Messier 07/15/23 2045

## 2023-07-16 ENCOUNTER — Emergency Department (HOSPITAL_COMMUNITY)

## 2023-07-16 MED ORDER — IOHEXOL 350 MG/ML SOLN
75.0000 mL | Freq: Once | INTRAVENOUS | Status: AC | PRN
  Administered 2023-07-16: 75 mL via INTRAVENOUS

## 2023-07-16 MED ORDER — KETOROLAC TROMETHAMINE 30 MG/ML IJ SOLN
30.0000 mg | Freq: Once | INTRAMUSCULAR | Status: AC
  Administered 2023-07-16: 30 mg via INTRAMUSCULAR
  Filled 2023-07-16: qty 1

## 2023-07-16 NOTE — ED Provider Notes (Signed)
 [facility] Emergency Department Provider Note MRN:  [id number]  Arrival date & time: 07/16/23     Chief Complaint   Abdominal Pain and Leg Pain   History of Present Illness   Laurie Perez is a 23 y.o. year-old female presents to the ED with chief complaint of left lower abdominal pain for the past week.  Symptoms have been gradually worsening.  She denies fevers, chills, n/v/d.  She denies any dysuria, hematuria, new or unusual vaginal discharge or bleeding.  She states that she has pain with walking and palpation.  She was seen at urgent care and referred to the ER for further evaluation and diagnostic imaging.  History provided by patient.   Review of Systems  Pertinent positive and negative review of systems noted in HPI.    Physical Exam   Vitals:   07/16/23 0530 07/16/23 0633  BP: 106/64   Pulse: 77   Resp:    Temp:  98.1 F (36.7 C)  SpO2: 100%     CONSTITUTIONAL:  well-appearing, NAD NEURO:  Alert and oriented x 3, CN 3-12 grossly intact EYES:  eyes equal and reactive ENT/NECK:  Supple, no stridor  CARDIO:  normal rate, regular rhythm, appears well-perfused  PULM:  No respiratory distress, CTAB GI/GU:  non-distended, left adnexal and pelvic pain MSK/SPINE:  No gross deformities, no edema, moves all extremities  SKIN:  no rash, atraumatic   *Additional and/or pertinent findings included in MDM below  Diagnostic and Interventional Summary    EKG Interpretation Date/Time:    Ventricular Rate:    PR Interval:    QRS Duration:    QT Interval:    QTC Calculation:   R Axis:      Text Interpretation:         Labs Reviewed  COMPREHENSIVE METABOLIC PANEL WITH GFR - Abnormal; Notable for the following components:      Result Value   Glucose, Bld 100 (*)    Calcium 8.8 (*)    Total Bilirubin 2.1 (*)    All other components within normal limits  CBC - Abnormal; Notable for the following components:   Hemoglobin 11.7 (*)     HCT 33.9 (*)    MCV 79.0 (*)    RDW 19.0 (*)    All other components within normal limits  URINALYSIS, ROUTINE W REFLEX MICROSCOPIC - Abnormal; Notable for the following components:   Color, Urine AMBER (*)    Bilirubin Urine SMALL (*)    All other components within normal limits  LIPASE, BLOOD  HCG, SERUM, QUALITATIVE    CT ABDOMEN PELVIS W CONTRAST  Final Result    US Pelvis Complete  Final Result    US Transvaginal Non-OB  Final Result    [place] Art/Ven Flow Abd Pelv Doppler  Final Result      Medications  ketorolac (TORADOL) 30 MG/ML injection 30 mg (30 mg Intramuscular Given 07/16/23 0143)  iohexol (OMNIPAQUE) 350 MG/ML injection 75 mL (75 mLs Intravenous Contrast Given 07/16/23 7829)     Procedures  /  Critical Care Procedures  ED Course and Medical Decision Making  I have reviewed the triage vital signs, the nursing notes, and pertinent available records from the EMR.  Social Determinants Affecting Complexity of Care: Patient has no clinically significant social determinants affecting this chief complaint..   ED Course:    Medical Decision Making Patient here with left lower abdominal pain.  Has been worsening over the past week.  She was  seen at urgent care and sent to the ED for diagnostic imaging.  Labs are reassuring.  Will check ultrasound.  If ultrasound is negative, will consider CT.  [place] is normal.  CT is without evidence of pathology that would cause LLQ pain.  She's not having any dysuria.  Doubt UTI.  Labs look good.  I don't think that she needs additional workup. Will discharge home with PCP follow-up.  Return precautions discussed.  Amount and/or Complexity of Data Reviewed Labs: ordered. Radiology: ordered.  Risk Prescription drug management.         Consultants: No consultations were needed in caring for this patient.   Treatment and Plan: I considered admission due to patient's initial presentation, but after considering the  examination and diagnostic results, patient will not require admission and can be discharged with outpatient follow-up.    Final Clinical Impressions(s) / ED Diagnoses     ICD-10-CM   1. Left lower quadrant abdominal pain  R10.32       ED Discharge Orders     None         Discharge Instructions Discussed with and Provided to Patient:   Discharge Instructions   None      Roxy Horseman, PA-C 07/16/23 1610    Zadie Rhine, MD 07/16/23 [phone]
